# Patient Record
Sex: Male | Born: 1955 | Race: White | Hispanic: No | Marital: Married | Smoking: Former smoker
Health system: Southern US, Community
[De-identification: ages and names within clinical notes are randomized; demographics above are authoritative.]

## PROBLEM LIST (undated history)

## (undated) DIAGNOSIS — E7800 Pure hypercholesterolemia, unspecified: Secondary | ICD-10-CM

## (undated) DIAGNOSIS — R7989 Other specified abnormal findings of blood chemistry: Secondary | ICD-10-CM

## (undated) DIAGNOSIS — I493 Ventricular premature depolarization: Secondary | ICD-10-CM

## (undated) DIAGNOSIS — N419 Inflammatory disease of prostate, unspecified: Secondary | ICD-10-CM

## (undated) HISTORY — PX: KNEE SURGERY: SHX244

## (undated) HISTORY — PX: NECK SURGERY: SHX720

## (undated) HISTORY — PX: LASIK: SHX215

---

## 2004-03-15 ENCOUNTER — Ambulatory Visit (HOSPITAL_COMMUNITY): Admission: RE | Admit: 2004-03-15 | Discharge: 2004-03-16 | Payer: Self-pay | Admitting: Neurosurgery

## 2004-05-03 ENCOUNTER — Encounter: Admission: RE | Admit: 2004-05-03 | Discharge: 2004-05-03 | Payer: Self-pay | Admitting: Neurosurgery

## 2005-01-30 IMAGING — CR DG CERVICAL SPINE 2 OR 3 VIEWS
3 series · 3 of 3 positions shown · non-contrast
Comparison: none

CLINICAL DATA: Cervical pain.   Status post ACDF.
 CERVICAL SPINE ? 05/03/04

[view not recorded (1 of 3)]
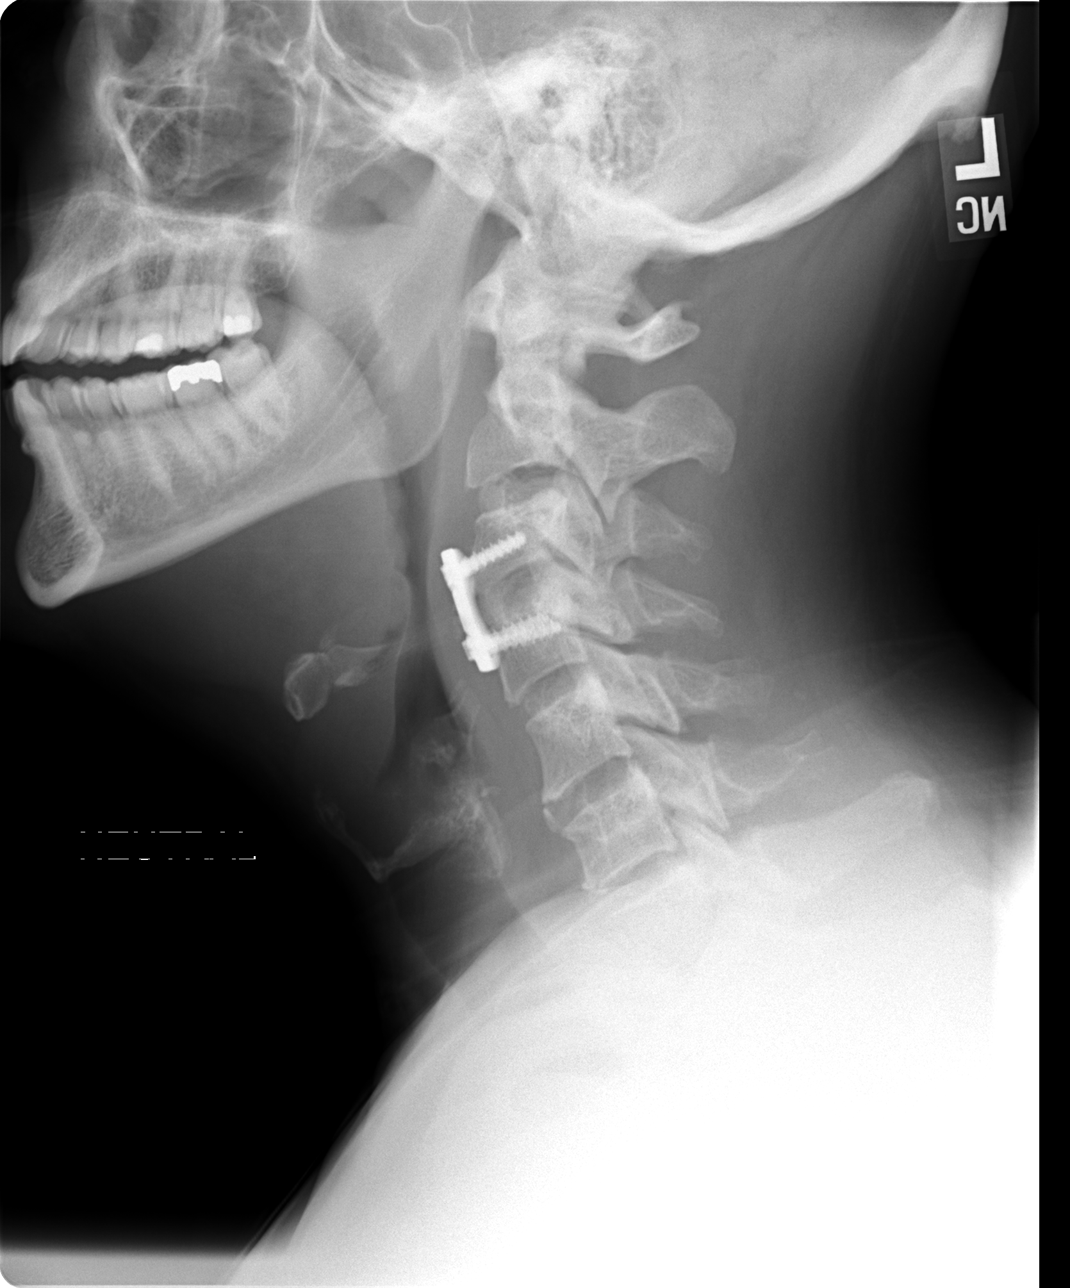

[view not recorded (2 of 3)]
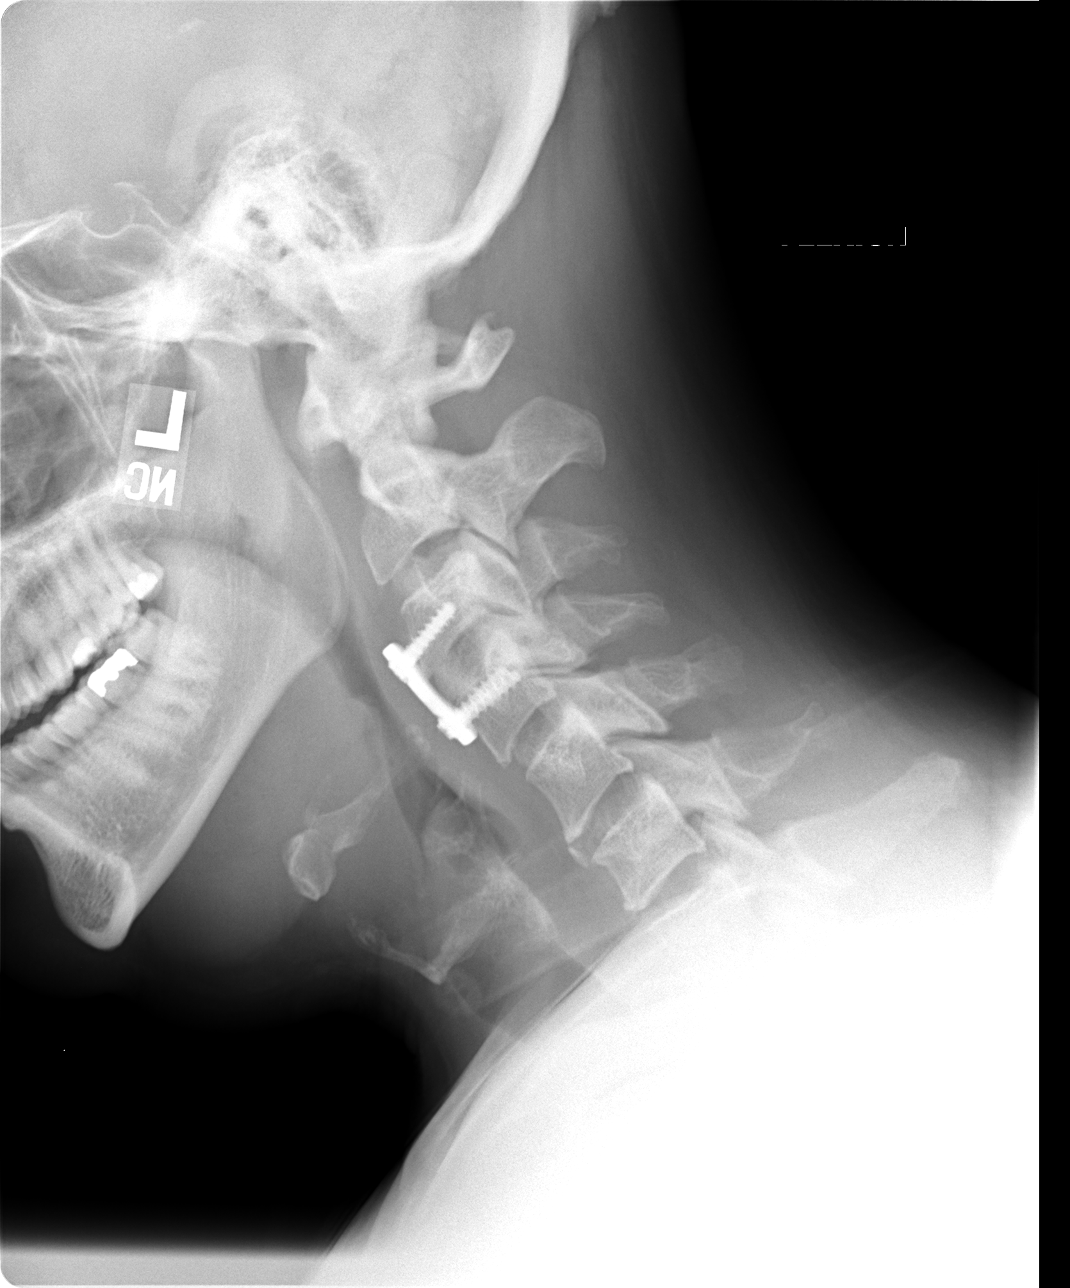

[view not recorded (3 of 3)]
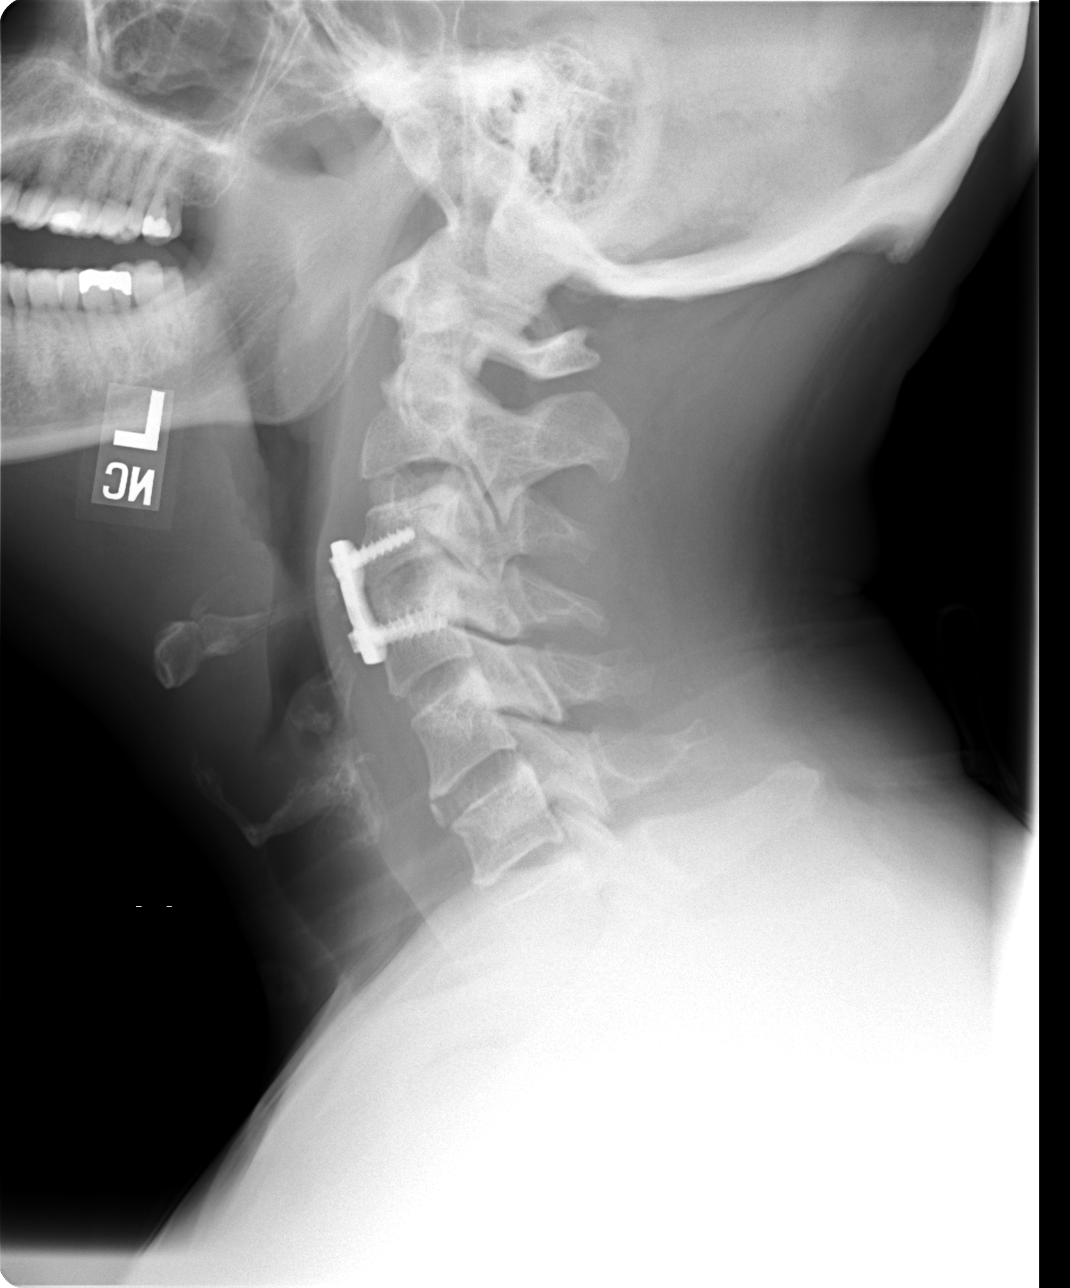

[3 of 3 positions shown; findings below may reference images not displayed]

FINDINGS: Three view exam of the cervical spine obtained.  The patient is status post anterior fusion from C3 to C4 with interbody bone graft.  Inferior cortex of the C3 vertebral body is still visible.  Alignment is anatomic.  With flexion/extension, there is no abnormal motion at the fused segments or at the adjacent segments.  The prevertebral soft tissues are within normal limits.
 IMPRESSION
 Status post anterior fusion from C3 to C4 without evidence for complicating features.

## 2012-10-28 ENCOUNTER — Encounter (HOSPITAL_BASED_OUTPATIENT_CLINIC_OR_DEPARTMENT_OTHER): Payer: Self-pay | Admitting: Emergency Medicine

## 2012-10-28 ENCOUNTER — Emergency Department (HOSPITAL_BASED_OUTPATIENT_CLINIC_OR_DEPARTMENT_OTHER)
Admission: EM | Admit: 2012-10-28 | Discharge: 2012-10-28 | Disposition: A | Discharge status: Home / Self Care | Payer: Managed Care, Other (non HMO) | Attending: Emergency Medicine | Admitting: Emergency Medicine

## 2012-10-28 ENCOUNTER — Emergency Department (HOSPITAL_BASED_OUTPATIENT_CLINIC_OR_DEPARTMENT_OTHER): Payer: Managed Care, Other (non HMO)

## 2012-10-28 DIAGNOSIS — Z8679 Personal history of other diseases of the circulatory system: Secondary | ICD-10-CM | POA: Insufficient documentation

## 2012-10-28 DIAGNOSIS — R079 Chest pain, unspecified: Secondary | ICD-10-CM

## 2012-10-28 DIAGNOSIS — R197 Diarrhea, unspecified: Secondary | ICD-10-CM | POA: Insufficient documentation

## 2012-10-28 DIAGNOSIS — E7800 Pure hypercholesterolemia, unspecified: Secondary | ICD-10-CM | POA: Insufficient documentation

## 2012-10-28 DIAGNOSIS — E291 Testicular hypofunction: Secondary | ICD-10-CM | POA: Insufficient documentation

## 2012-10-28 DIAGNOSIS — R0789 Other chest pain: Secondary | ICD-10-CM | POA: Insufficient documentation

## 2012-10-28 DIAGNOSIS — Z79899 Other long term (current) drug therapy: Secondary | ICD-10-CM | POA: Insufficient documentation

## 2012-10-28 DIAGNOSIS — R0602 Shortness of breath: Secondary | ICD-10-CM | POA: Insufficient documentation

## 2012-10-28 DIAGNOSIS — Z87891 Personal history of nicotine dependence: Secondary | ICD-10-CM | POA: Insufficient documentation

## 2012-10-28 DIAGNOSIS — Z87448 Personal history of other diseases of urinary system: Secondary | ICD-10-CM | POA: Insufficient documentation

## 2012-10-28 HISTORY — DX: Pure hypercholesterolemia, unspecified: E78.00

## 2012-10-28 HISTORY — DX: Other specified abnormal findings of blood chemistry: R79.89

## 2012-10-28 HISTORY — DX: Inflammatory disease of prostate, unspecified: N41.9

## 2012-10-28 HISTORY — DX: Ventricular premature depolarization: I49.3

## 2012-10-28 LAB — BASIC METABOLIC PANEL
CO2: 25 mEq/L (ref 19–32)
Calcium: 9.6 mg/dL (ref 8.4–10.5)
Chloride: 103 mEq/L (ref 96–112)
Creatinine, Ser: 1.1 mg/dL (ref 0.50–1.35)
GFR calc non Af Amer: 72 mL/min — ABNORMAL LOW (ref >90)
Sodium: 141 mEq/L (ref 135–145)

## 2012-10-28 LAB — CBC
Hemoglobin: 14 g/dL (ref 13.0–17.0)
MCH: 32.7 pg (ref 26.0–34.0)
MCHC: 33.8 g/dL (ref 30.0–36.0)
RBC: 4.28 MIL/uL (ref 4.22–5.81)

## 2012-10-28 MED ORDER — ASPIRIN 325 MG PO TABS
325.0000 mg | ORAL_TABLET | ORAL | Status: AC
  Administered 2012-10-28: 325 mg via ORAL
  Filled 2012-10-28: qty 1

## 2012-10-28 NOTE — ED Provider Notes (Signed)
History  This chart was scribed for Geoffery Lyons, MD by Shari Heritage, ED Scribe. The patient was seen in room MH12/MH12. Patient's care was started at 1531.   CSN: [id number]  Arrival date & time 10/28/12  1441   First MD Initiated Contact with Patient 10/28/12 1531      Chief Complaint  Patient presents with  . Chest Pain  . Shortness of Breath  . Diarrhea    The history is provided by the patient. No language interpreter was used.    HPI Comments: Clarence Mccarthy is a 56 y.o. male who presents to the Emergency Department complaining of pressure-like, waxing and waning, mild chest pain onset yesterday. Patient states that he has experienced this type of discomfort before. There is associated shortness of breath, generalized weakness and diarrhea. Patient denies nausea, vomiting or diaphoresis. Patient denies any pain that radiates down his arm or any pain in the jaw. Pain is not aggravated by food or exertion. Patient says that he walks recreationally every day. He is a retired Emergency planning/management officer. Patient hasn't had a stress test in the past 10 years. Patient states that his PCP set up an appointment with a cardiologist for him on January 6 after he noticed mild palpitations in his chest , but patient says that he cancelled the appointment. Patient has no history of diabetes. Patient has a medical history of PVC, low testosterone, hypercholesteremia and prostatitis. Patient currently takes Lipitor 10 mg and testosterone gel daily. Patient has a family history of congestive heart failure (mother) and heart attack (maternal grandfather). Patient is former smoker.    Past Medical History  Diagnosis Date  . PVC (premature ventricular contraction)   . Low testosterone   . Hypercholesteremia   . Prostatitis     Past Surgical History  Procedure Date  . Neck surgery   . Knee surgery   . Lasik     History reviewed. No pertinent family history.  History  Substance Use Topics  . Smoking  status: Former Games developer  . Smokeless tobacco: Never Used  . Alcohol Use: 13.8 oz/week    7 Cans of beer, 14 Shots of liquor, 2 Glasses of wine per week      Review of Systems  Constitutional: Negative for diaphoresis.  Respiratory: Positive for shortness of breath.   Cardiovascular: Positive for chest pain.  Gastrointestinal: Positive for diarrhea. Negative for nausea and vomiting.  Neurological: Positive for weakness.  All other systems reviewed and are negative.    Allergies  Review of patient's allergies indicates no known allergies.  Home Medications   Current Outpatient Rx  Name  Route  Sig  Dispense  Refill  . ATORVASTATIN CALCIUM 10 MG PO TABS   Oral   Take 10 mg by mouth daily.         . TESTOSTERONE 50 MG/5GM TD GEL   Transdermal   Place 5 g onto the skin daily.           There were no vitals taken for this visit.  Physical Exam  Constitutional: He is oriented to person, place, and time. He appears well-developed and well-nourished. No distress.  HENT:  Head: Normocephalic and atraumatic.  Eyes: Conjunctivae normal and EOM are normal. Pupils are equal, round, and reactive to light.  Neck: Neck supple.  Cardiovascular: Normal rate and regular rhythm.   No murmur heard. Pulmonary/Chest: Effort normal and breath sounds normal. No respiratory distress. He has no wheezes. He has no rales.  Abdominal:  Soft. Bowel sounds are normal. He exhibits no distension. There is no tenderness. There is no rebound and no guarding.  Musculoskeletal: Normal range of motion. He exhibits no edema and no tenderness.  Neurological: He is alert and oriented to person, place, and time. He has normal strength. No cranial nerve deficit or sensory deficit. Coordination normal.  Skin: Skin is warm and dry.    ED Course  Procedures (including critical care time)  COORDINATION OF CARE: 3:41 PM- Patient informed of current plan for treatment and evaluation and agrees with plan at  this time.  Results for orders placed during the hospital encounter of 10/28/12  CBC      Component Value Range   WBC 7.7  4.0 - 10.5 K/uL   RBC 4.28  4.22 - 5.81 MIL/uL   Hemoglobin 14.0  13.0 - 17.0 g/dL   HCT 40.9  81.1 - 91.4 %   MCV 96.7  78.0 - 100.0 fL   MCH 32.7  26.0 - 34.0 pg   MCHC 33.8  30.0 - 36.0 g/dL   RDW 78.2  95.6 - 21.3 %   Platelets 227  150 - 400 K/uL  BASIC METABOLIC PANEL      Component Value Range   Sodium 141  135 - 145 mEq/L   Potassium 3.9  3.5 - 5.1 mEq/L   Chloride 103  96 - 112 mEq/L   CO2 25  19 - 32 mEq/L   Glucose, Bld 111 (*) 70 - 99 mg/dL   BUN 20  6 - 23 mg/dL   Creatinine, Ser 0.86  0.50 - 1.35 mg/dL   Calcium 9.6  8.4 - 57.8 mg/dL   GFR calc non Af Amer 72 (*) >90 mL/min   GFR calc Af Amer 84 (*) >90 mL/min  TROPONIN I      Component Value Range   Troponin I <0.30  <0.30 ng/mL    Dg Chest 2 View  10/28/2012  *RADIOLOGY REPORT*  Clinical Data: Chest pain, shortness of breath  CHEST - 2 VIEW  Comparison: None.  Findings: No active infiltrate or effusion is seen.  There is cardiomegaly present.  There are degenerative changes throughout the thoracic spine.  IMPRESSION: Cardiomegaly.  No active lung disease.   Original Report Authenticated By: Dwyane Dee, M.D.      No diagnosis found.   Date: 10/28/2012  Rate: 72  Rhythm: normal sinus rhythm  QRS Axis: normal  Intervals: normal  ST/T Wave abnormalities: nonspecific T wave changes  Conduction Disutrbances:none  Narrative Interpretation:   Old EKG Reviewed: none available    MDM  The patient presents here with two day history of atypical chest pains.  The workup is unremarkable and he is feeling better.  I believe that he needs to have a stress test to further rule out a cardiac etiology.  He prefers to do this as an outpatient.  He tells me he will call his pcp tomorrow to make these arrangements.  He is to return here if he worsens.  I have also provided him with the contact  information with [facility] cardiology should he prefer to go there.       I personally performed the services described in this documentation, which was scribed in my presence. The recorded information has been reviewed and is accurate.      Geoffery Lyons, MD 10/28/12 [phone]

## 2012-10-28 NOTE — ED Notes (Signed)
Pt states he was having some chest pain while sitting at work.  Pain went away and came back today but less severe.  Pt admits to some sob, and weakness.  No N/V but some diarrhea.  No diaphoresis.

## 2012-10-28 NOTE — ED Notes (Signed)
MD at bedside. 

## 2012-10-28 NOTE — ED Notes (Signed)
D/c home at this time- verbalizes understanding to f/u with cardiology tomorrow and s/s to return to ED- no r/x given

## 2013-07-27 IMAGING — CR DG CHEST 2V
2 series · 2 of 2 positions shown · non-contrast
Comparison: None.

CLINICAL DATA: Chest pain, shortness of breath

CHEST - 2 VIEW

[w chest pa]
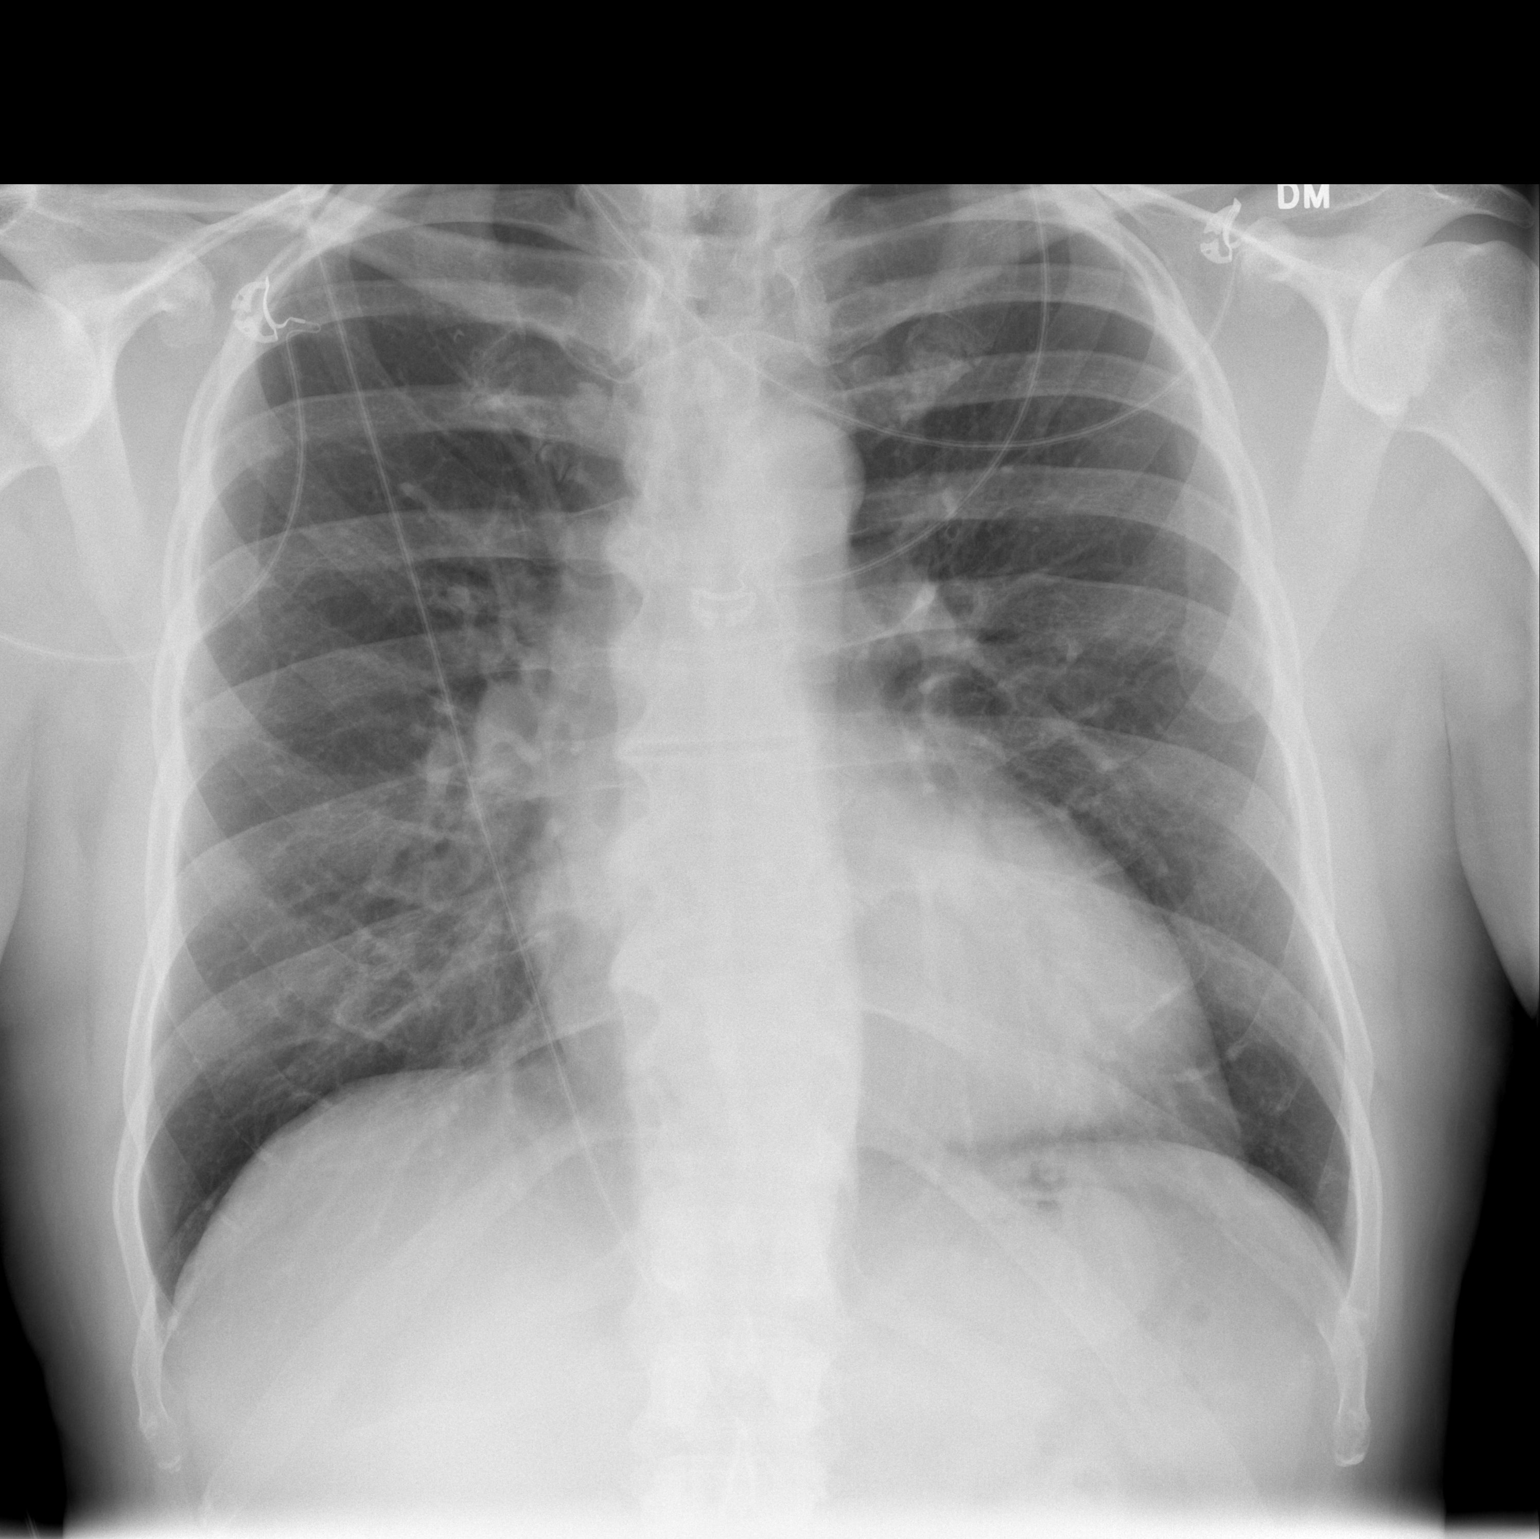

[w chest lat]
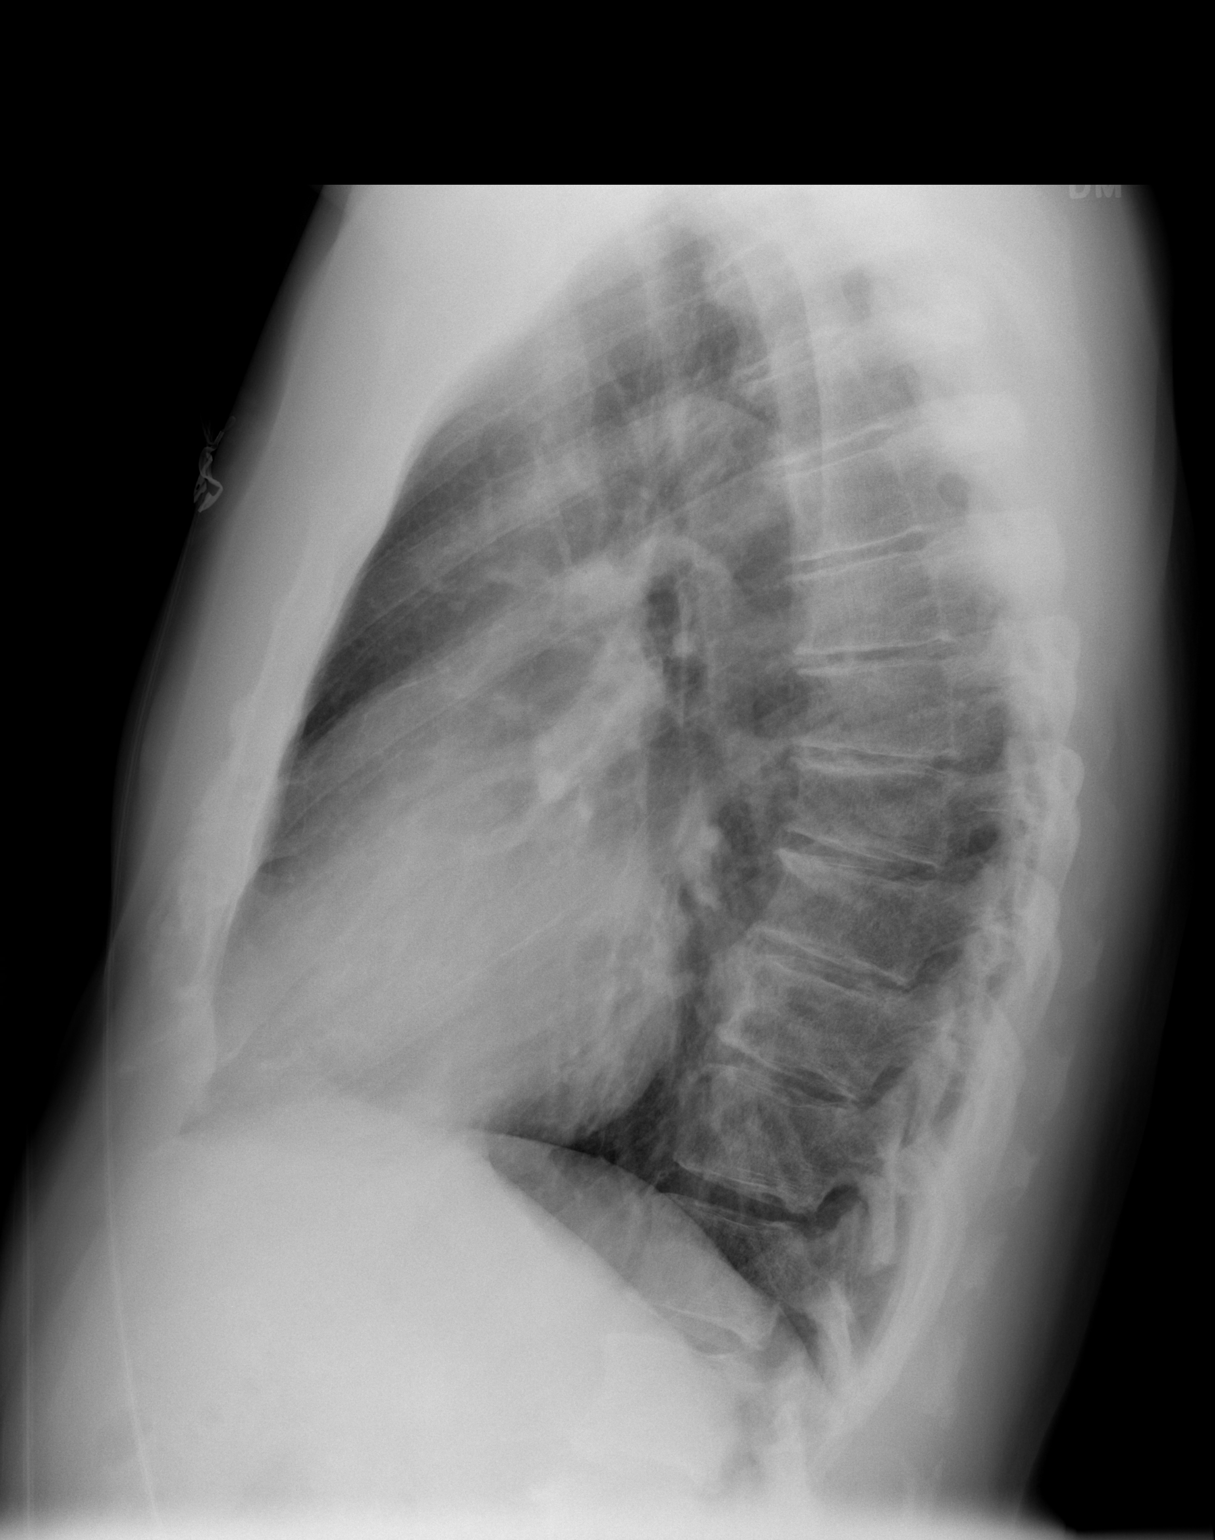

[2 of 2 positions shown; findings below may reference images not displayed]

FINDINGS: No active infiltrate or effusion is seen.  There is
cardiomegaly present.  There are degenerative changes throughout
the thoracic spine.
IMPRESSION: Cardiomegaly.  No active lung disease.

## 2021-11-08 ENCOUNTER — Other Ambulatory Visit: Payer: Self-pay

## 2021-11-08 ENCOUNTER — Emergency Department (HOSPITAL_BASED_OUTPATIENT_CLINIC_OR_DEPARTMENT_OTHER)
Admission: EM | Admit: 2021-11-08 | Discharge: 2021-11-08 | Disposition: A | Discharge status: Home / Self Care | Payer: Medicare HMO | Attending: Emergency Medicine | Admitting: Emergency Medicine

## 2021-11-08 ENCOUNTER — Encounter (HOSPITAL_BASED_OUTPATIENT_CLINIC_OR_DEPARTMENT_OTHER): Payer: Self-pay

## 2021-11-08 DIAGNOSIS — R339 Retention of urine, unspecified: Secondary | ICD-10-CM | POA: Insufficient documentation

## 2021-11-08 DIAGNOSIS — R338 Other retention of urine: Secondary | ICD-10-CM

## 2021-11-08 LAB — URINALYSIS, ROUTINE W REFLEX MICROSCOPIC
Bilirubin Urine: NEGATIVE
Glucose, UA: NEGATIVE mg/dL
Hgb urine dipstick: NEGATIVE
Ketones, ur: NEGATIVE mg/dL
Leukocytes,Ua: NEGATIVE
Nitrite: NEGATIVE
Protein, ur: NEGATIVE mg/dL
Specific Gravity, Urine: 1.015 (ref 1.005–1.030)
pH: 5.5 (ref 5.0–8.0)

## 2021-11-08 MED ORDER — TAMSULOSIN HCL 0.4 MG PO CAPS: 0.4000 mg | ORAL_CAPSULE | Freq: Every day | ORAL | 0 refills | Status: AC

## 2021-11-08 NOTE — ED Triage Notes (Signed)
Pt states he has been unable to urinate since 0500. Last urination at 0100. Denies abdominal pain, c/o penile/testicular pain.

## 2021-11-08 NOTE — Discharge Instructions (Addendum)
It was our pleasure to provide your ER care today - we hope that you feel better.  Empty leg bag as need. There is a balloon in your bladder - do not attempt to remove catheter without balloon being deflated as injury to urethra/bladder can result.   Drink plenty of fluids/stay well hydrated. Take flomax as prescribed.   Follow up with urologist in the coming week - call office today or in AM tomorrow to arrange appointment.   Return to ER if worse, new symptoms, fevers, new/severe pain, catheter not working, or other concern.

## 2021-11-08 NOTE — ED Notes (Signed)
AVS reviewed with client, catheter instructions provided. Pt instructed on keeping bag below bladder level, also how to empty urinary bag as well. Pt returned demonstration on emptying bag. Also discussed Rx provided by the ED Provider. Opportunity for questions provided. Urine in bag remains golden color and clear. Pt states he feels much better post catheter insertion

## 2021-11-08 NOTE — ED Provider Notes (Addendum)
[place] EMERGENCY DEPARTMENT Provider Note   CSN: [id number] Arrival date & time: 11/08/21  1024     History  Chief Complaint  Patient presents with   Urinary Retention    Clarence Mccarthy is a 66 y.o. male.  Patient c/o urinary retention. Symptoms acute onset today, moderate, persistent. Hx mild enlarged prostate in past. No recent surgery or change in meds. No hematuria or dysuria. No flank pain. Recent mildly weak stream, but in general has felt he's been able to empty bladder when urinating. No fever or chills.   The history is provided by the patient and medical records.      Home Medications Prior to Admission medications   Medication Sig Start Date End Date Taking? Authorizing Provider  atorvastatin (LIPITOR) 10 MG tablet Take 10 mg by mouth daily.    [provider]  testosterone (ANDROGEL) 50 MG/5GM GEL Place 5 g onto the skin daily.    [provider]      Allergies    Patient has no known allergies.    Review of Systems   Review of Systems  Constitutional:  Negative for chills and fever.  Respiratory:  Negative for shortness of breath.   Gastrointestinal:  Negative for abdominal pain and vomiting.  Genitourinary:  Negative for dysuria, flank pain and hematuria.  Musculoskeletal:  Negative for back pain.  Neurological:  Negative for weakness.  Hematological:  Does not bruise/bleed easily.  Psychiatric/Behavioral:  Negative for confusion.    Physical Exam Updated Vital Signs BP (!) 149/80 (BP Location: Right Arm)    Pulse 75    Temp 97.8 F (36.6 C) (Oral)    Resp 18    Ht 1.829 m (6')    Wt 102.1 kg    SpO2 100%    BMI 30.52 kg/m  Physical Exam Vitals and nursing note reviewed.  Constitutional:      Appearance: Normal appearance. He is well-developed.  HENT:     Head: Atraumatic.     Nose: Nose normal.     Mouth/Throat:     Mouth: Mucous membranes are moist.  Eyes:     General: No scleral icterus.    Conjunctiva/sclera:  Conjunctivae normal.  Neck:     Trachea: No tracheal deviation.  Cardiovascular:     Rate and Rhythm: Normal rate.     Pulses: Normal pulses.  Pulmonary:     Effort: Pulmonary effort is normal. No accessory muscle usage or respiratory distress.  Abdominal:     General: Bowel sounds are normal. There is no distension.     Palpations: Abdomen is soft.     Tenderness: There is no abdominal tenderness.  Genitourinary:    Comments: No cva tenderness. Normal external gu exam (foley already placed by RN).  ~ 1000 ml clear yellow urine in bag.  Musculoskeletal:        General: No swelling.     Cervical back: Neck supple.  Skin:    General: Skin is warm and dry.     Findings: No rash.  Neurological:     Mental Status: He is alert.     Comments: Alert, speech clear. Steady gait.   Psychiatric:        Mood and Affect: Mood normal.    ED Results / Procedures / Treatments   Labs (all labs ordered are listed, but only abnormal results are displayed) Results for orders placed or performed during the hospital encounter of 11/08/21  Urinalysis, Routine w reflex  microscopic Urine, Catheterized  Result Value Ref Range   Color, Urine YELLOW YELLOW   APPearance CLEAR CLEAR   Specific Gravity, Urine 1.015 1.005 - 1.030   pH 5.5 5.0 - 8.0   Glucose, UA NEGATIVE NEGATIVE mg/dL   Hgb urine dipstick NEGATIVE NEGATIVE   Bilirubin Urine NEGATIVE NEGATIVE   Ketones, ur NEGATIVE NEGATIVE mg/dL   Protein, ur NEGATIVE NEGATIVE mg/dL   Nitrite NEGATIVE NEGATIVE   Leukocytes,Ua NEGATIVE NEGATIVE     EKG None  Radiology No results found.  Procedures Procedures    Medications Ordered in ED Medications - No data to display  ED Course/ Medical Decision Making/ A&P                           Medical Decision Making  Foley placed. Lab ordered.   Reviewed nursing notes and prior charts for additional history. Outside records reviewed.   Labs reviewed/interpreted by me - no uti.   Rx  flomax.   Leg bag for home.   Recheck abd soft non tender, earlier symptoms resolved w foley.   Rec outpatient urology f/u.  Return precautions provided.           Final Clinical Impression(s) / ED Diagnoses Final diagnoses:  None    Rx / DC Orders ED Discharge Orders     None         Lajean Saver, MD 11/08/21 1154

## 2021-11-08 NOTE — ED Notes (Signed)
Urinary catheter inserted without difficulty, pt tolerated very well, 80fr/10ml secured to rt thigh with cath safety/secure device. EDP in at bedside
